# Patient Record
Sex: Female | Born: 1995 | Race: White | Hispanic: No | Marital: Single | Smoking: Never smoker
Health system: Southern US, Community
[De-identification: ages and names within clinical notes are randomized; demographics above are authoritative.]

## PROBLEM LIST (undated history)

## (undated) DIAGNOSIS — L709 Acne, unspecified: Secondary | ICD-10-CM

## (undated) HISTORY — DX: Acne, unspecified: L70.9

---
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
## 2020-05-25 DIAGNOSIS — H5213 Myopia, bilateral: Secondary | ICD-10-CM | POA: Diagnosis not present

## 2020-08-31 DIAGNOSIS — Z23 Encounter for immunization: Secondary | ICD-10-CM | POA: Diagnosis not present

## 2021-01-25 DIAGNOSIS — C44509 Unspecified malignant neoplasm of skin of other part of trunk: Secondary | ICD-10-CM | POA: Diagnosis not present

## 2021-01-25 DIAGNOSIS — L700 Acne vulgaris: Secondary | ICD-10-CM | POA: Diagnosis not present

## 2021-01-25 DIAGNOSIS — D225 Melanocytic nevi of trunk: Secondary | ICD-10-CM | POA: Diagnosis not present

## 2021-01-25 DIAGNOSIS — D1801 Hemangioma of skin and subcutaneous tissue: Secondary | ICD-10-CM | POA: Diagnosis not present

## 2021-04-26 DIAGNOSIS — D225 Melanocytic nevi of trunk: Secondary | ICD-10-CM | POA: Diagnosis not present

## 2021-04-26 DIAGNOSIS — L700 Acne vulgaris: Secondary | ICD-10-CM | POA: Diagnosis not present

## 2021-07-30 DIAGNOSIS — Z23 Encounter for immunization: Secondary | ICD-10-CM | POA: Diagnosis not present

## 2021-07-30 DIAGNOSIS — H5213 Myopia, bilateral: Secondary | ICD-10-CM | POA: Diagnosis not present

## 2021-08-09 DIAGNOSIS — L700 Acne vulgaris: Secondary | ICD-10-CM | POA: Diagnosis not present

## 2021-09-29 DIAGNOSIS — Z23 Encounter for immunization: Secondary | ICD-10-CM | POA: Diagnosis not present

## 2021-12-27 DIAGNOSIS — L700 Acne vulgaris: Secondary | ICD-10-CM | POA: Diagnosis not present

## 2022-01-11 ENCOUNTER — Other Ambulatory Visit: Payer: Self-pay

## 2022-01-11 ENCOUNTER — Encounter: Payer: BC Managed Care – PPO | Admitting: Nurse Practitioner

## 2022-02-01 ENCOUNTER — Other Ambulatory Visit: Payer: Self-pay

## 2022-02-01 ENCOUNTER — Ambulatory Visit: Payer: BC Managed Care – PPO | Admitting: Nurse Practitioner

## 2022-02-01 ENCOUNTER — Encounter: Payer: Self-pay | Admitting: Nurse Practitioner

## 2022-02-01 ENCOUNTER — Other Ambulatory Visit (HOSPITAL_COMMUNITY)
Admission: RE | Admit: 2022-02-01 | Discharge: 2022-02-01 | Disposition: A | Discharge status: Home / Self Care | Payer: BC Managed Care – PPO | Source: Ambulatory Visit | Attending: Nurse Practitioner | Admitting: Nurse Practitioner

## 2022-02-01 VITALS — BP 136/84 | Ht 66.0 in | Wt 129.0 lb

## 2022-02-01 DIAGNOSIS — Z01419 Encounter for gynecological examination (general) (routine) without abnormal findings: Secondary | ICD-10-CM

## 2022-02-01 DIAGNOSIS — R1031 Right lower quadrant pain: Secondary | ICD-10-CM

## 2022-02-01 DIAGNOSIS — Z3041 Encounter for surveillance of contraceptive pills: Secondary | ICD-10-CM | POA: Diagnosis not present

## 2022-02-01 NOTE — Progress Notes (Signed)
? ?  Alicia Brewer Apr 12, 1996 [id number] ? ? ?History:  26 y.o. G0 presents to establish care. She complains of RLQ abdominal pain for 6-12 months that is intermittent and sharp, sometimes crampy. It occurs a 2-3 times per week and is not cyclic. Sometimes she notices it more when lying on right side. Monthly cycles. Virgin. Acne managed by dermatology.  ? ?Gynecologic History ?Patient's last menstrual period was 01/26/2022. ?Period Cycle (Days): 28 ?Period Duration (Days): 5 ?Period Pattern: Regular ?Menstrual Flow: Light ?Dysmenorrhea: (!) Mild ?Dysmenorrhea Symptoms: Cramping ?Contraception/Family planning: OCP (estrogen/progesterone) ?Sexually active: No ? ?Health Maintenance ?Last Pap: Never ?Last mammogram: Not indicated ?Last colonoscopy: Not indicated  ?Last Dexa: Not indicated ? ?Past medical history, past surgical history, family history and social history were all reviewed and documented in the EPIC chart. 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  ? ?ROS:  A ROS was performed and pertinent positives and negatives are included. ? ?Exam: ? ?Vitals:  ? 02/01/22 1343  ?BP: 136/84  ?Weight: 129 lb (58.5 kg)  ?Height: 5\' 6"  (1.676 m)  ? ?Body mass index is 20.82 kg/m?. ? ?General appearance:  Normal ?Thyroid:  Symmetrical, normal in size, without palpable masses or nodularity. ?Respiratory ? Auscultation:  Clear without wheezing or rhonchi ?Cardiovascular ? Auscultation:  Regular rate, without rubs, murmurs or gallops ? Edema/varicosities:  Not grossly evident ?Abdominal ? Soft,nontender, without masses, guarding or rebound. ? Liver/spleen:  No organomegaly noted ? Hernia:  None appreciated ? Skin ? Inspection:  Grossly normal ?Breasts: Examined lying and sitting.  ? Right: Without masses, retractions, nipple discharge or axillary adenopathy. ? ? Left: Without masses, retractions, nipple discharge or axillary adenopathy. ?Genitourinary  ? Inguinal/mons:  Normal without inguinal  adenopathy ? External genitalia:  Normal appearing vulva with no masses, tenderness, or lesions ? BUS/Urethra/Skene's glands:  Normal ? Vagina:  Normal appearing with normal color and discharge, no lesions ? Cervix:  Unable to visualize d/t difficult exam ? Uterus:  Normal in size, shape and contour.  Midline and mobile, nontender ? Adnexa/parametria:   ?  Rt: Normal in size, without masses or tenderness. ?  Lt: Normal in size, without masses or tenderness. ? Anus and perineum: Normal ? Digital rectal exam: Not indicated ? ?Patient informed chaperone available to be present for breast and pelvic exam. Patient has requested no chaperone to be present. Patient has been advised what will be completed during breast and pelvic exam.  ? ?Assessment/Plan:  26 y.o. G0 for annual exam.  ? ?Well female exam with routine gynecological exam - Plan: Cytology - PAP( [facility]). Education provided on SBEs, importance of preventative screenings, current guidelines, high calcium diet, regular exercise, and multivitamin daily.  ? ?Encounter for surveillance of contraceptive pills - Prescribed by dermatology. Taking as prescribed. Uses for acne.  ? ?Right lower quadrant pain - Plan: 22 Pelvis Complete. She complains of RLQ abdominal pain for 6-12 months that is intermittent and sharp, sometimes crampy. It occurs a 2-3 times per week and is not cyclic. Sometimes she notices it more when lying on right side. ? ?Screening for cervical cancer - Initial pap today.  ? ?Return in 1 year for annual.  ? ?[place] DNP, 2:12 PM 02/01/2022 ? ?

## 2022-02-02 LAB — CYTOLOGY - PAP
Adequacy: ABSENT
Diagnosis: NEGATIVE

## 2022-02-22 ENCOUNTER — Ambulatory Visit (INDEPENDENT_AMBULATORY_CARE_PROVIDER_SITE_OTHER): Payer: BC Managed Care – PPO

## 2022-02-22 ENCOUNTER — Encounter: Payer: Self-pay | Admitting: Obstetrics and Gynecology

## 2022-02-22 ENCOUNTER — Ambulatory Visit: Payer: BC Managed Care – PPO | Admitting: Obstetrics and Gynecology

## 2022-02-22 VITALS — BP 120/74 | HR 76 | Ht 66.0 in | Wt 129.0 lb

## 2022-02-22 DIAGNOSIS — R1031 Right lower quadrant pain: Secondary | ICD-10-CM

## 2022-02-22 DIAGNOSIS — M7918 Myalgia, other site: Secondary | ICD-10-CM | POA: Diagnosis not present

## 2022-02-22 MED ORDER — IBUPROFEN 800 MG PO TABS: 800.0000 mg | ORAL_TABLET | Freq: Three times a day (TID) | ORAL | 1 refills | Status: DC | PRN

## 2022-02-22 MED ORDER — CYCLOBENZAPRINE HCL 5 MG PO TABS: 5.0000 mg | ORAL_TABLET | Freq: Three times a day (TID) | ORAL | 0 refills | Status: DC | PRN

## 2022-02-22 NOTE — Patient Instructions (Signed)

## 2022-02-22 NOTE — Progress Notes (Signed)
GYNECOLOGY  VISIT ?  ?HPI: ?26 y.o.   Single White or Caucasian Not Hispanic or Latino  female   ?G0P0000 with Patient's last menstrual period was 01/26/2022.   ?here for ultrasound consult  for a 6-12 month h/o intermittent RLQ abdominal pain. She has monthly cycles on OCP's. Prior to going on OCP's she would occasionally get midcycle pain. The pain she has now feels like that pain. The pain is intermittent, coming daily for the last month, getting worse. Can be worse with twisting. Mostly the pain is a jabbing sensation, sometimes dull. Ranges in pain from a 2-5/10 in severity. ?No change in her pain with her cycle. ?She has a BM every day, firm, occasionally needs to strain. She gets bloated. No change in the pain with eating or BM.  ?She denies pelvic pain. Never sexually active.  ? ? ? ?GYNECOLOGIC HISTORY: ?Patient's last menstrual period was 01/26/2022. ?Contraception:  OCP  ?Menopausal hormone therapy: none ?       ?OB History   ? ? Gravida  ?0  ? Para  ?0  ? Term  ?0  ? Preterm  ?0  ? AB  ?0  ? Living  ?0  ?  ? ? SAB  ?0  ? IAB  ?0  ? Ectopic  ?0  ? Multiple  ?0  ? Live Births  ?0  ?   ?  ?  ?    ? ?Patient Active Problem List  ? Diagnosis Date Noted  ? Back pain 08/04/2013  ? Shortness of breath 08/04/2013  ? ? ?Past Medical History:  ?Diagnosis Date  ? Acne   ? ? ?No past surgical history on file. ? ?Current Outpatient Medications  ?Medication Sig Dispense Refill  ? spironolactone (ALDACTONE) 50 MG tablet Take 50 mg by mouth daily.    ? tretinoin (RETIN-A) 0.025 % cream SMARTSIG:sparingly Topical Every Night    ? TRI-ESTARYLLA 0.18/0.215/0.25 MG-35 MCG tablet Take 1 tablet by mouth daily.    ? ?No current facility-administered medications for this visit.  ?  ? ?ALLERGIES: Patient has no known allergies. ? ?Family History  ?Problem Relation Age of Onset  ? Cancer Father   ?     skin  ? Cancer Maternal Grandmother   ?     Non hodgkins lymphoma  ? Cancer Paternal Grandfather   ?     Lung  ? ? ?Social History   ? ?Socioeconomic History  ? Marital status: Single  ?  Spouse name: Not on file  ? Number of children: Not on file  ? Years of education: Not on file  ? Highest education level: Not on file  ?Occupational History  ? Not on file  ?Tobacco Use  ? Smoking status: Never  ? Smokeless tobacco: Not on file  ?Substance and Sexual Activity  ? Alcohol use: Never  ? Drug use: Never  ? Sexual activity: Never  ?  Birth control/protection: Pill  ?  Comment: Virgin  ?Other Topics Concern  ? Not on file  ?Social History Narrative  ? Not on file  ? ?Social Determinants of Health  ? ?Financial Resource Strain: Not on file  ?Food Insecurity: Not on file  ?Transportation Needs: Not on file  ?Physical Activity: Not on file  ?Stress: Not on file  ?Social Connections: Not on file  ?Intimate Partner Violence: Not on file  ? ? ?Review of Systems  ?All other systems reviewed and are negative. ? ?PHYSICAL EXAMINATION:   ? ?BP  120/74   Wt 129 lb (58.5 kg)   LMP 01/26/2022   BMI 20.82 kg/m?     ?General appearance: alert, cooperative and appears stated age ?Abdomen: soft, tender with palpation in her right lower quadrant (inferiorly). More tender with palpation of her tensed abdominal muscles. Minimally distended, no masses,  no organomegaly ? ? ?Pelvic ultrasound ? ?Indications: RLQ abdominal pain ? ?Findings: ? ?Uterus 7.29 x 4.84 x 3.21 cm anteverted uterus ? ?Endometrium 2.73 mm, thin, symmetrical, no masses ? ?Left ovary 3.36 x 1.97 x 1.06 ? ?Right ovary 3.09 x 1.75 x 1.97 ? ?No free fluid ? ?Impression:  ?Normal pelvic ultrasound ? ?1. RLQ abdominal pain ?History and exam are most c/w MS cause of her pain. Negative pelvic ultrasound.  ?- ibuprofen (ADVIL) 800 MG tablet; Take 1 tablet (800 mg total) by mouth every 8 (eight) hours as needed.  Dispense: 30 tablet; Refill: 1 ?-Can use heat or ice as needed ? ?2. Musculoskeletal pain ?-See above ?- cyclobenzaprine (FLEXERIL) 5 MG tablet; Take 1 tablet (5 mg total) by mouth 3 (three) times  daily as needed for muscle spasms.  Dispense: 30 tablet; Refill: 0 ?-We discussed PT, she will let me know if she wants to do this ? ?CC: Wyline Beady, NP ?

## 2022-03-24 DIAGNOSIS — L700 Acne vulgaris: Secondary | ICD-10-CM | POA: Diagnosis not present

## 2022-08-08 DIAGNOSIS — H5213 Myopia, bilateral: Secondary | ICD-10-CM | POA: Diagnosis not present

## 2023-02-06 ENCOUNTER — Encounter: Payer: Self-pay | Admitting: Nurse Practitioner

## 2023-02-06 ENCOUNTER — Ambulatory Visit (INDEPENDENT_AMBULATORY_CARE_PROVIDER_SITE_OTHER): Payer: BC Managed Care – PPO | Admitting: Nurse Practitioner

## 2023-02-06 VITALS — BP 134/82 | HR 109 | Ht 66.0 in | Wt 128.0 lb

## 2023-02-06 DIAGNOSIS — L700 Acne vulgaris: Secondary | ICD-10-CM

## 2023-02-06 DIAGNOSIS — M954 Acquired deformity of chest and rib: Secondary | ICD-10-CM

## 2023-02-06 DIAGNOSIS — Z01419 Encounter for gynecological examination (general) (routine) without abnormal findings: Secondary | ICD-10-CM | POA: Diagnosis not present

## 2023-02-06 NOTE — Progress Notes (Signed)
   Alicia Brewer October 14, 1996 [id number]   History:  27 y.o. G0 presents for annual exam. Reports having raised area on left chest. Seen at [facility] 01/11/2023 for this with normal ultrasound that showed it was her ribs. She is unsure if it has always been that way and she just noticed it because of some soreness there. Told years back that she has mild kyphosis. Monthly cycles. Acne managed by dermatology.   Gynecologic History Patient's last menstrual period was 01/23/2023 (approximate). Period Cycle (Days): 28 Period Duration (Days): 4-5 Period Pattern: Regular Menstrual Flow: Light Menstrual Control: Maxi pad Menstrual Control Change Freq (Hours): 9 Dysmenorrhea: (!) Mild Dysmenorrhea Symptoms: Cramping, Diarrhea Contraception/Family planning: OCP (estrogen/progesterone) Sexually active: Never  Health Maintenance Last Pap: 02/01/2022. Results were: Normal Last mammogram: Not indicated Last colonoscopy: Not indicated  Last Dexa: Not indicated  Past medical history, past surgical history, family history and social history were all reviewed and documented in the EPIC chart. 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   ROS:  A ROS was performed and pertinent positives and negatives are included.  Exam:  Vitals:   02/06/23 0933  BP: 134/82  Pulse: (!) 109  SpO2: 100%  Weight: 128 lb (58.1 kg)  Height: 5\' 6"  (1.676 m)    Body mass index is 20.66 kg/m.  General appearance:  Normal Thyroid:  Symmetrical, normal in size, without palpable masses or nodularity. Respiratory  Auscultation:  Clear without wheezing or rhonchi Chest wall   2 ribs protrude on left anterior chest wall towards sternum, no pain Cardiovascular  Auscultation:  Regular rate, without rubs, murmurs or gallops  Edema/varicosities:  Not grossly evident Abdominal  Soft,nontender, without masses, guarding or rebound.  Liver/spleen:  No organomegaly noted  Hernia:  None  appreciated Skin  Inspection:  Grossly normal Breasts: Examined lying and sitting.   Right: Without masses, retractions, nipple discharge or axillary adenopathy.   Left: Without masses, retractions, nipple discharge or axillary adenopathy. Genitourinary Not indicated  Patient informed chaperone available to be present for breast and pelvic exam. Patient has requested no chaperone to be present. Patient has been advised what will be completed during breast and pelvic exam.   Assessment/Plan:  27 y.o. G0 for annual exam.   Well female exam with routine gynecological exam - Education provided on SBEs, importance of preventative screenings, current guidelines, high calcium diet, regular exercise, and multivitamin daily.   Acne vulgaris - COCs prescribed by derm. Taking as prescribed.   Chest wall deformity - 2 ribs protrude slightly on left anterior side towards sternum. No pain with palpation. Pain is very mild and she mostly notices it with seatbelt rubbing. Could have mild inflammation in intercostal spaces. Ibuprofen and heat as needed. Not breast related. Normal chest ultrasound.   Screening for cervical cancer - Normal pap history. Will repeat at 3-year interval per guidelines.   Return in 1 year for annual.     Tamela Gammon DNP, 9:50 AM 02/06/2023

## 2024-02-19 ENCOUNTER — Ambulatory Visit (INDEPENDENT_AMBULATORY_CARE_PROVIDER_SITE_OTHER): Payer: Self-pay | Admitting: Nurse Practitioner

## 2024-02-19 ENCOUNTER — Encounter: Payer: Self-pay | Admitting: Nurse Practitioner

## 2024-02-19 VITALS — BP 122/84 | HR 99 | Ht 65.0 in | Wt 125.0 lb

## 2024-02-19 DIAGNOSIS — L700 Acne vulgaris: Secondary | ICD-10-CM

## 2024-02-19 DIAGNOSIS — Z0000 Encounter for general adult medical examination without abnormal findings: Secondary | ICD-10-CM

## 2024-02-19 DIAGNOSIS — Z1331 Encounter for screening for depression: Secondary | ICD-10-CM | POA: Diagnosis not present

## 2024-02-19 DIAGNOSIS — Z01419 Encounter for gynecological examination (general) (routine) without abnormal findings: Secondary | ICD-10-CM

## 2024-02-19 NOTE — Progress Notes (Signed)
   Alicia Brewer [birth year] [id number]   History:  28 y.o. G0 presents for annual exam. Monthly cycles. Normal pap history. Acne managed by dermatology.   Gynecologic History Patient's last menstrual period was 01/30/2024 (approximate). Period Cycle (Days): 28 Period Duration (Days): 3-4 Period Pattern: Regular Menstrual Flow: Light Menstrual Control: Maxi pad Dysmenorrhea: (!) Mild Contraception/Family planning: OCP (estrogen/progesterone) Sexually active: Never  Health Maintenance Last Pap: 02/01/2022. Results were: Normal Last mammogram: Not indicated Last colonoscopy: Not indicated  Last Dexa: Not indicated  Flowsheet Row Office Visit from 02/19/2024 in [facility]  PHQ-2 Total Score 0        Past medical history, past surgical history, family history and social history were all reviewed and documented in the EPIC chart. Civil engineer, contracting.  ROS:  A ROS was performed and pertinent positives and negatives are included.  Exam:  Vitals:   02/19/24 1147  BP: 122/84  Pulse: 99  SpO2: 96%  Weight: 125 lb (56.7 kg)  Height: 5\' 5"  (1.651 m)     Body mass index is 20.8 kg/m.  General appearance:  Normal Thyroid:  Symmetrical, normal in size, without palpable masses or nodularity. Respiratory  Auscultation:  Clear without wheezing or rhonchi Chest wall   2 ribs protrude on left anterior chest wall towards sternum, no pain Cardiovascular  Auscultation:  Regular rate, without rubs, murmurs or gallops  Edema/varicosities:  Not grossly evident Abdominal  Soft,nontender, without masses, guarding or rebound.  Liver/spleen:  No organomegaly noted  Hernia:  None appreciated Skin  Inspection:  Grossly normal Breasts: Examined lying and sitting.   Right: Without masses, retractions, nipple discharge or axillary adenopathy.   Left: Without masses, retractions, nipple discharge or axillary adenopathy. Pelvic: External genitalia:  no  lesions              Urethra:  normal appearing urethra with no masses, tenderness or lesions              Bartholins and Skenes: normal                 Vagina: Speculum exam not performed              Cervix: Speculum exam not performed Bimanual Exam:  Uterus:  no masses or tenderness              Adnexa: no mass, fullness, tenderness              Rectovaginal: Deferred              Anus:  normal, no lesions  Patient informed chaperone available to be present for breast and pelvic exam. Patient has requested no chaperone to be present. Patient has been advised what will be completed during breast and pelvic exam.   Assessment/Plan:  28 y.o. G0 for annual exam.   Well female exam with routine gynecological exam - Education provided on SBEs, importance of preventative screenings, current guidelines, high calcium diet, regular exercise, and multivitamin daily.   Acne vulgaris - COCs prescribed by derm. Taking as prescribed.   Screening for cervical cancer - Normal pap history. Will repeat at 3-year interval per guidelines. Tearful and anxious during visit today with worry about pap next year. Requesting Valium prior. She will reach out prior to annual for prescription. Aware will need a ride.   Return in about 1 year (around 02/18/2025) for Annual.    Olivia Mackie DNP, 11:58 AM 02/19/2024

## 2025-02-19 ENCOUNTER — Ambulatory Visit: Admitting: Nurse Practitioner
